# Patient Record
Sex: Female | Born: 1980 | Race: White | Hispanic: No | Marital: Married | Smoking: Never smoker
Health system: Southern US, Community
[De-identification: ages and names within clinical notes are randomized; demographics above are authoritative.]

---

## 2017-04-25 ENCOUNTER — Inpatient Hospital Stay (HOSPITAL_COMMUNITY): Payer: PRIVATE HEALTH INSURANCE

## 2017-04-25 ENCOUNTER — Inpatient Hospital Stay (HOSPITAL_COMMUNITY)
Admission: AD | Admit: 2017-04-25 | Discharge: 2017-04-25 | Discharge status: Against Medical Advice | Payer: PRIVATE HEALTH INSURANCE | Source: Ambulatory Visit | Attending: Obstetrics and Gynecology | Admitting: Obstetrics and Gynecology

## 2017-04-25 ENCOUNTER — Encounter (HOSPITAL_COMMUNITY): Payer: Self-pay | Admitting: *Deleted

## 2017-04-25 DIAGNOSIS — O864 Pyrexia of unknown origin following delivery: Secondary | ICD-10-CM | POA: Insufficient documentation

## 2017-04-25 DIAGNOSIS — D72829 Elevated white blood cell count, unspecified: Secondary | ICD-10-CM | POA: Insufficient documentation

## 2017-04-25 DIAGNOSIS — R509 Fever, unspecified: Secondary | ICD-10-CM | POA: Diagnosis present

## 2017-04-25 DIAGNOSIS — Z9889 Other specified postprocedural states: Secondary | ICD-10-CM | POA: Insufficient documentation

## 2017-04-25 DIAGNOSIS — Z5321 Procedure and treatment not carried out due to patient leaving prior to being seen by health care provider: Secondary | ICD-10-CM | POA: Diagnosis not present

## 2017-04-25 LAB — COMPREHENSIVE METABOLIC PANEL
ALT: 12 U/L — AB (ref 14–54)
AST: 14 U/L — AB (ref 15–41)
Albumin: 2.3 g/dL — ABNORMAL LOW (ref 3.5–5.0)
Alkaline Phosphatase: 63 U/L (ref 38–126)
Anion gap: 8 (ref 5–15)
BILIRUBIN TOTAL: 0.9 mg/dL (ref 0.3–1.2)
BUN: 11 mg/dL (ref 6–20)
CO2: 23 mmol/L (ref 22–32)
CREATININE: 0.68 mg/dL (ref 0.44–1.00)
Calcium: 8 mg/dL — ABNORMAL LOW (ref 8.9–10.3)
Chloride: 105 mmol/L (ref 101–111)
GFR calc Af Amer: >60 mL/min (ref >60)
Glucose, Bld: 93 mg/dL (ref 65–99)
Potassium: 3.6 mmol/L (ref 3.5–5.1)
Sodium: 136 mmol/L (ref 135–145)
TOTAL PROTEIN: 5.4 g/dL — AB (ref 6.5–8.1)

## 2017-04-25 LAB — CBC WITH DIFFERENTIAL/PLATELET
BASOS ABS: 0 10*3/uL (ref 0.0–0.1)
Basophils Relative: 0 %
Eosinophils Absolute: 0 10*3/uL (ref 0.0–0.7)
Eosinophils Relative: 0 %
HEMATOCRIT: 28.9 % — AB (ref 36.0–46.0)
Hemoglobin: 9.9 g/dL — ABNORMAL LOW (ref 12.0–15.0)
LYMPHS ABS: 2.6 10*3/uL (ref 0.7–4.0)
LYMPHS PCT: 13 %
MCH: 32 pg (ref 26.0–34.0)
MCHC: 34.3 g/dL (ref 30.0–36.0)
MCV: 93.5 fL (ref 78.0–100.0)
MONO ABS: 0.6 10*3/uL (ref 0.1–1.0)
Monocytes Relative: 3 %
NEUTROS ABS: 16.2 10*3/uL — AB (ref 1.7–7.7)
Neutrophils Relative %: 84 %
Platelets: 350 10*3/uL (ref 150–400)
RBC: 3.09 MIL/uL — AB (ref 3.87–5.11)
RDW: 13.7 % (ref 11.5–15.5)
WBC: 19.4 10*3/uL — ABNORMAL HIGH (ref 4.0–10.5)

## 2017-04-25 LAB — URINALYSIS, ROUTINE W REFLEX MICROSCOPIC
Bacteria, UA: NONE SEEN
Bilirubin Urine: NEGATIVE
Glucose, UA: NEGATIVE mg/dL
Ketones, ur: 5 mg/dL — AB
Nitrite: NEGATIVE
PH: 6 (ref 5.0–8.0)
PROTEIN: NEGATIVE mg/dL
RBC / HPF: NONE SEEN RBC/hpf (ref 0–5)
SPECIFIC GRAVITY, URINE: 1.005 (ref 1.005–1.030)

## 2017-04-25 MED ORDER — CLINDAMYCIN PHOSPHATE 900 MG/50ML IV SOLN: 900.0000 mg | Freq: Three times a day (TID) | INTRAVENOUS | Status: DC

## 2017-04-25 MED ORDER — CLINDAMYCIN HCL 300 MG PO CAPS: 900.0000 mg | ORAL_CAPSULE | Freq: Three times a day (TID) | ORAL | Status: DC

## 2017-04-25 NOTE — Progress Notes (Signed)
OB Attending Please see NP note for information Pt had c section 6 days ago in [place]. [facility] fever, elevated WBC and exam consistent with postpartum endometritis.  Pt and husband were reluctant to be admitted d/t to child care issues and insurance coverage. I spoke to pt and husband regarding reasoning's for admission and potential complications if not admitted. Made several attempts to formulate a plan to their satisfaction.  Following discussion the pt and husband discussed there options and elected to leave AMA. Pt and husband stated that they were going to [place] to be evaluated d./t above aforementioned issues.  Nursing was notified and pt signed out AMA.

## 2017-04-25 NOTE — MAU Provider Note (Signed)
History     CSN: [id number]  Arrival date and time: 04/25/17 1840   First Provider Initiated Contact with Patient 04/25/17 1935      No chief complaint on file.  HPI    Ms.Kelli Noble is a 36 y.o. female G3P2100 status post primary cesarean section on 5/8 due to failure to descend. Her surgery was done at [facility] by Dr. Alfredo Batty. Pregnancy and surgery were both "uncomplicated".  Patient started feeling bad yesterday: chills and fever. Temperature was checked today and it was 103. She took percocet @ 1630.   She is breast feeding. She denies breast pain. Baby having some trouble latching, otherwise no problems. She is emptying her breast every 2-3 hours.  She complains of bilateral, mild lower abdominal pain that she rates 5/10. She also complains of lower back pain ; bilateral that she rates 6/10. + Light vaginal bleeding.   OB History    Gravida Para Term Preterm AB Living   3 3 2 1        SAB TAB Ectopic Multiple Live Births                  History reviewed. No pertinent past medical history.  History reviewed. No pertinent surgical history.  History reviewed. No pertinent family history.  Social History  Substance Use Topics  . Smoking status: Never Smoker  . Smokeless tobacco: Never Used  . Alcohol use No    Allergies: No Known Allergies  No prescriptions prior to admission.   Results for orders placed or performed during the hospital encounter of 04/25/17 (from the past 48 hour(s))  Urinalysis, Routine w reflex microscopic     Status: Abnormal   Collection Time: 04/25/17  6:52 PM  Result Value Ref Range   Color, Urine STRAW (A) YELLOW   APPearance CLEAR CLEAR   Specific Gravity, Urine 1.005 1.005 - 1.030   pH 6.0 5.0 - 8.0   Glucose, UA NEGATIVE NEGATIVE mg/dL   Hgb urine dipstick SMALL (A) NEGATIVE   Bilirubin Urine NEGATIVE NEGATIVE   Ketones, ur 5 (A) NEGATIVE mg/dL   Protein, ur NEGATIVE NEGATIVE mg/dL   Nitrite NEGATIVE NEGATIVE   Leukocytes, UA  TRACE (A) NEGATIVE   RBC / HPF NONE SEEN 0 - 5 RBC/hpf   WBC, UA 0-5 0 - 5 WBC/hpf   Bacteria, UA NONE SEEN NONE SEEN   Squamous Epithelial / LPF 0-5 (A) NONE SEEN  CBC with Differential     Status: Abnormal   Collection Time: 04/25/17  7:43 PM  Result Value Ref Range   WBC 19.4 (H) 4.0 - 10.5 K/uL   RBC 3.09 (L) 3.87 - 5.11 MIL/uL   Hemoglobin 9.9 (L) 12.0 - 15.0 g/dL   HCT 40.9 (L) 81.1 - 91.4 %   MCV 93.5 78.0 - 100.0 fL   MCH 32.0 26.0 - 34.0 pg   MCHC 34.3 30.0 - 36.0 g/dL   RDW 78.2 95.6 - 21.3 %   Platelets 350 150 - 400 K/uL   Neutrophils Relative % 84 %   Neutro Abs 16.2 (H) 1.7 - 7.7 K/uL   Lymphocytes Relative 13 %   Lymphs Abs 2.6 0.7 - 4.0 K/uL   Monocytes Relative 3 %   Monocytes Absolute 0.6 0.1 - 1.0 K/uL   Eosinophils Relative 0 %   Eosinophils Absolute 0.0 0.0 - 0.7 K/uL   Basophils Relative 0 %   Basophils Absolute 0.0 0.0 - 0.1 K/uL   Review of Systems  Constitutional: Positive for chills and fever.  Gastrointestinal: Positive for abdominal pain.  Musculoskeletal: Positive for back pain.   Physical Exam   Blood pressure 112/67, pulse 89, temperature 99.1 F (37.3 C), temperature source Oral, resp. rate 16.  Physical Exam  Constitutional: She is oriented to person, place, and time. She appears well-developed and well-nourished. No distress.  HENT:  Head: Normocephalic.  Eyes: Pupils are equal, round, and reactive to light.  Respiratory: Right breast exhibits no inverted nipple, no mass, no nipple discharge, no skin change and no tenderness. Left breast exhibits no inverted nipple, no mass, no nipple discharge, no skin change and no tenderness. Breasts are symmetrical.  GI: Soft. Normal appearance. There is tenderness in the right lower quadrant, periumbilical area, suprapubic area and left lower quadrant. There is no rigidity, no rebound, no guarding and no CVA tenderness.  Genitourinary:  Genitourinary Comments: + uterine tenderness on bimanual exam   Scant bleeding, mild odor.   Musculoskeletal: Normal range of motion.  Neurological: She is alert and oriented to person, place, and time.  Skin: Skin is warm. She is not diaphoretic.  Psychiatric: Her behavior is normal.    MAU Course  Procedures  None  MDM  Blood cultures pending CBC with diff CMP Urine culture  US due to elevated WBC 19,000 & Fever 103 Dr. Alysia PennaErvin to MAU to see patient and discuss admission  Clindamycin & Gentamycin ordered Report given to Dr. Omer JackMumaw who resumes care of the patient. Patient and significant other uncertain about staying. Concerned about cost and distance from home. Dr. Alysia PennaErvin to MAU to help discuss/ answer questions with the family.   Rasch, Harolyn RutherfordJennifer I, NP   Assessment and Plan   A:    Postpartum fever likely endometriosis Leukocytosis   I resumed care of this patient and she left AMA - stating [facility]. Please see attending note.  Ernestina PennaNicholas Reis Pienta, MD 04/27/17 9:27 AM

## 2017-04-25 NOTE — MAU Note (Signed)
Patient states that she wishes to leave AMA.  Dr. Alysia PennaErvin and Venia CarbonJennifer Rasch, NP discussed with patient and husband the dangers of leaving without treatment considering the patient's current condition of health.  Patient and husband verbalized understanding and still wish to leave against medical advice.  Patient's AMA form signed and placed in chart.

## 2017-04-25 NOTE — MAU Note (Signed)
Post c-section 6 days ago.  Pt has had a headache for two days and has a sore back and mild abdominal cramping.  Had a temp of 103.5. Minimal vaginal bleeding.

## 2017-04-27 LAB — CULTURE, OB URINE
Culture: <10000 — AB
Special Requests: NORMAL

## 2018-09-30 IMAGING — US US PELVIS COMPLETE
1 series · 15 of 25 positions shown · non-contrast
Comparison: None

CLINICAL DATA: Status post C-section 6 days ago.  Febrile to 103.5.

EXAM:
TRANSABDOMINAL ULTRASOUND OF PELVIS
TECHNIQUE: Transabdominal ultrasound examination of the pelvis was performed
including evaluation of the uterus, ovaries, adnexal regions, and
pelvic cul-de-sac.

[Series 1: us pelvis complete · 15 of 38 slices shown]
[im 1/38]
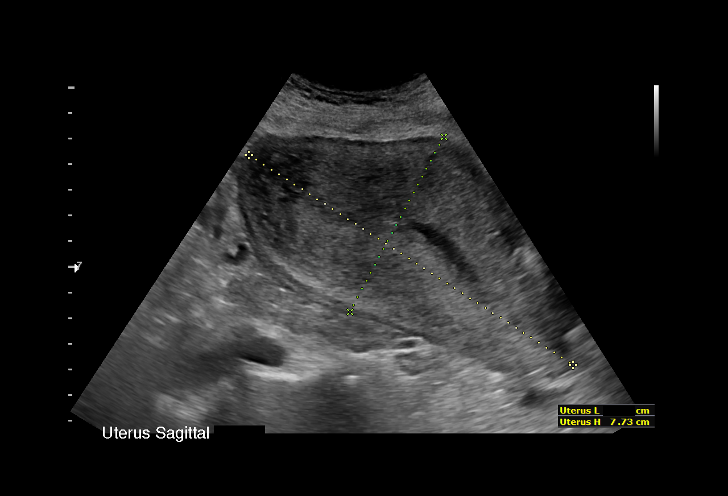
[im 4/38]
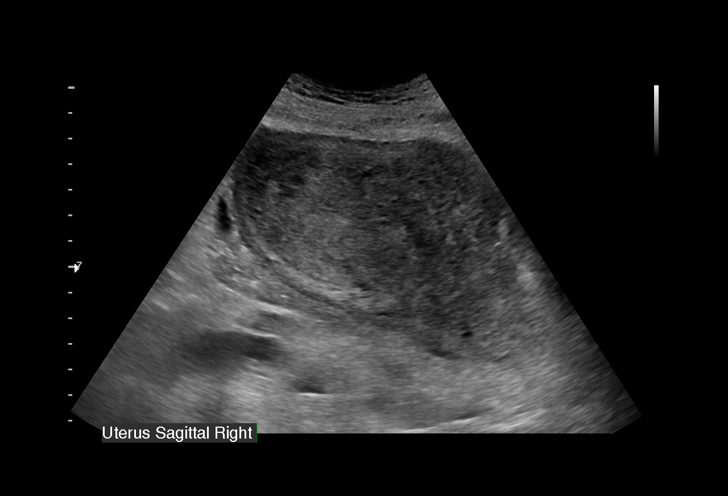
[im 7/38]
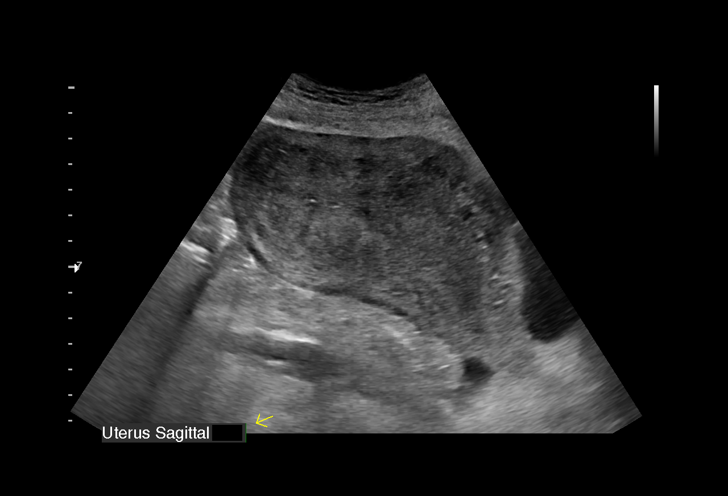
[im 8/38]
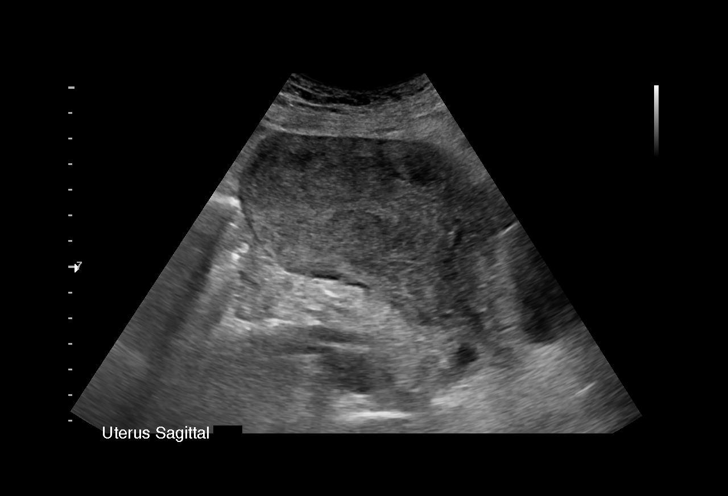
[im 11/38]
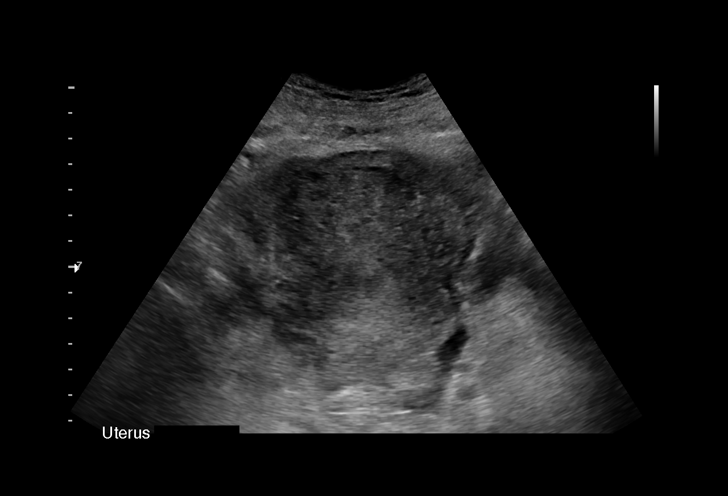
[im 14/38]
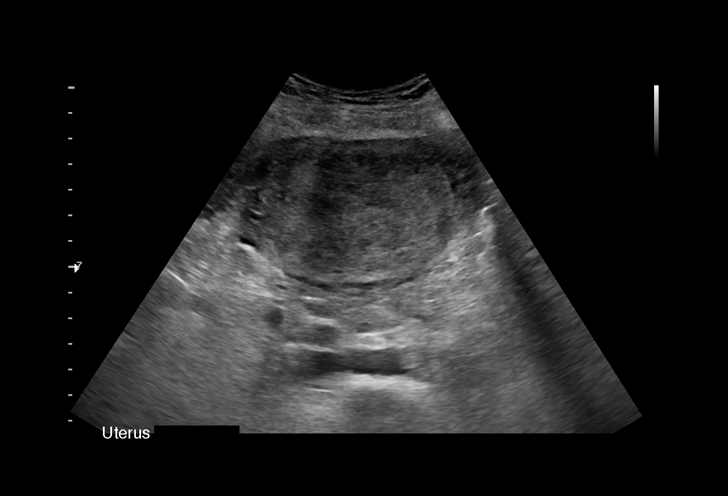
[im 16/38]
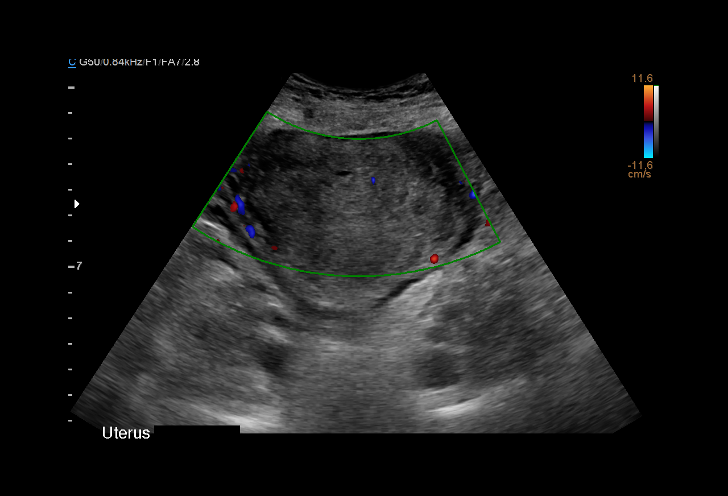
[im 19/38]
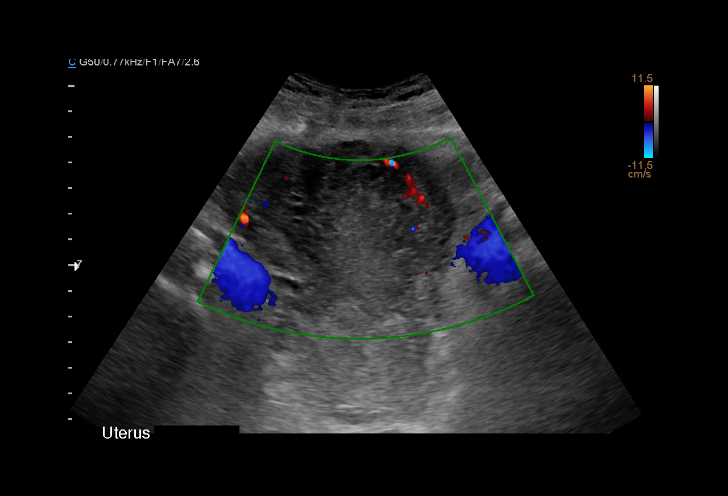
[im 22/38]
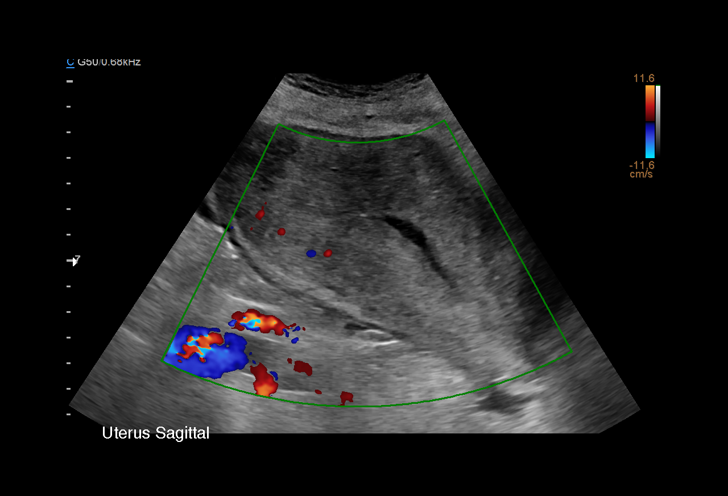
[im 24/38]
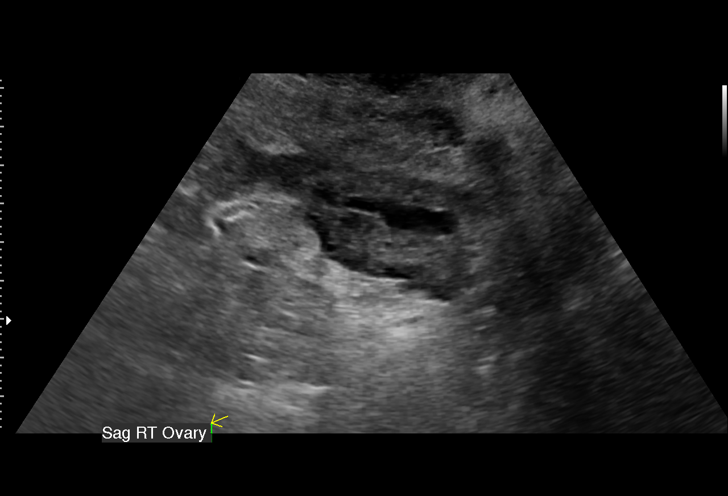
[im 27/38]
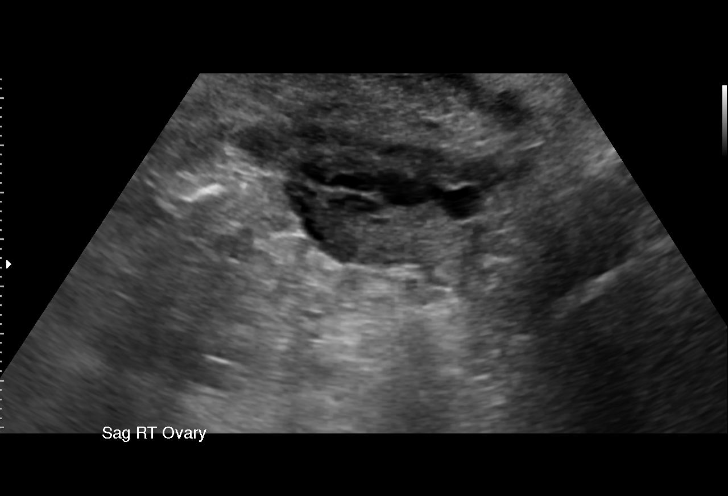
[im 30/38]
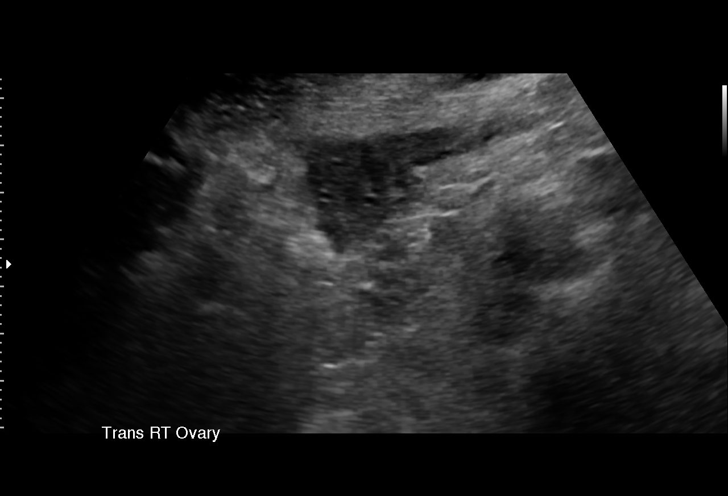
[im 31/38]
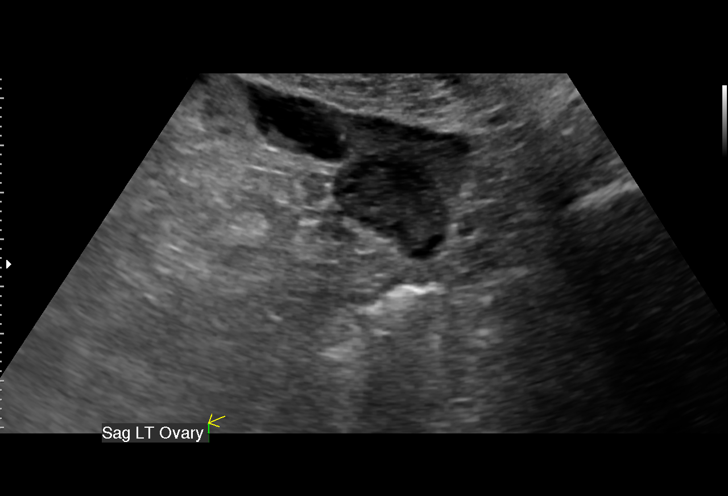
[im 34/38]
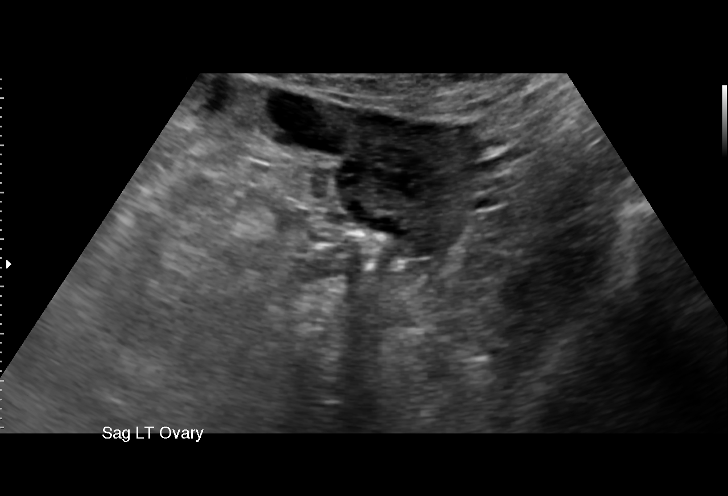
[im 38/38]
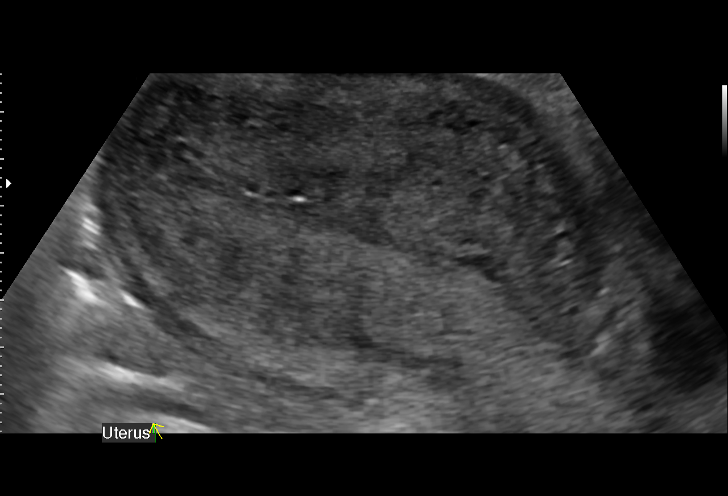

[15 of 25 positions shown; findings below may reference images not displayed]

FINDINGS: Uterus

Measurements: At least 15.0 x 7.7 x 9.1 cm. No fibroids or other
mass visualized.

Endometrium

Thickness: 10 mm. No focal abnormality visualized. Small amount of
fluid is identified in the lower uterine segment canal.

Right ovary

Measurements: 4.2 x 1.6 x 1.9 cm. Normal appearance/no adnexal mass.

Left ovary

Measurements: 2.8 x 1.8 x 2.4 cm. Normal appearance/no adnexal mass.

Other findings:  No abnormal free fluid.
IMPRESSION: 1. Enlarged postpartum uterus.
2. No evidence for retained products or adnexal mass.
3. Small amount of endometrial fluid in the lower uterine segment.
